# Patient Record
Sex: Female | Born: 1985 | Race: White | Hispanic: No | Marital: Married
Health system: Southern US, Community
[De-identification: ages and names within clinical notes are randomized; demographics above are authoritative.]

---

## 2014-01-05 ENCOUNTER — Other Ambulatory Visit: Payer: Self-pay

## 2014-02-07 ENCOUNTER — Other Ambulatory Visit (HOSPITAL_COMMUNITY): Payer: Self-pay | Admitting: Unknown Physician Specialty

## 2014-02-07 DIAGNOSIS — Z3689 Encounter for other specified antenatal screening: Secondary | ICD-10-CM

## 2014-02-07 DIAGNOSIS — O283 Abnormal ultrasonic finding on antenatal screening of mother: Secondary | ICD-10-CM

## 2014-02-08 ENCOUNTER — Ambulatory Visit (HOSPITAL_COMMUNITY)
Admission: RE | Admit: 2014-02-08 | Discharge: 2014-02-08 | Disposition: A | Discharge status: Home / Self Care | Payer: BC Managed Care – PPO | Source: Ambulatory Visit | Attending: Unknown Physician Specialty | Admitting: Unknown Physician Specialty

## 2014-02-08 ENCOUNTER — Encounter (HOSPITAL_COMMUNITY): Payer: Self-pay

## 2014-02-08 DIAGNOSIS — O283 Abnormal ultrasonic finding on antenatal screening of mother: Secondary | ICD-10-CM

## 2014-02-08 DIAGNOSIS — O289 Unspecified abnormal findings on antenatal screening of mother: Secondary | ICD-10-CM | POA: Insufficient documentation

## 2014-02-08 DIAGNOSIS — Z3689 Encounter for other specified antenatal screening: Secondary | ICD-10-CM | POA: Insufficient documentation

## 2014-02-08 NOTE — Progress Notes (Signed)
MATERNAL FETAL MEDICINE CONSULT  Patient Name: Linda Pineda Medical Record Number:  [id number] Date of Birth: [birth year] Requesting Physician Name:  Ernestina PennaNigel Buist, MD Date ofHassie Bruce Service: 02/08/2014  Chief Complaint Echogenic bowel  History of Present Illness Linda Pineda was seen today secondary to echogenic bowel at the request of Ernestina PennaNigel Buist, MD.  The patient is a 28 y.o. G1P0,at 265w1d with an EDD of 06/20/2014, by Last Menstrual Period dating method.  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  She had some vaginal spotting approximately 8-9 weeks ago.  She reports no recent illnesses or sick contacts.  Neither she nor her husband have a family history of cystic fibrosis, but they have not had carrier screening.  She had a first trimester screen that demonstrated a 1 in 10,000 risk of Downs Syndrome, Trisomy 13, and Trisomy 7418.  Her pregnancy has been otherwise uncomplicated.  [name] is healthy and has no chronic medical problems.    Review of Systems Pertinent items are noted in HPI.  Patient History OB History  Gravida Para Term Preterm AB SAB TAB Ectopic Multiple Living  1             # Outcome Date GA Lbr Len/2nd Weight Sex Delivery Anes PTL Lv  1 CUR               No past medical history on file.  No past surgical history on file.  History   Social History  . Marital Status: Married    Spouse Name: N/A    Number of Children: N/A  . Years of Education: N/A   Social History Main Topics  . Smoking status: Not on file  . Smokeless tobacco: Not on file  . Alcohol Use: Not on file  . Drug Use: Not on file  . Sexual Activity: Not on file   Other Topics Concern  . Not on file   Social History Narrative  . No narrative on file    Family History Neither [name] no her husband have a family history of mental retardation, birth defects, or genetic diseases.  Physical Examination Vitals - BP 137/81, Pulse 118, Weight 134 lbs. General appearance  - alert, well appearing, and in no distress Abdomen - soft, nontender, nondistended, no masses or organomegaly Extremities - no pedal edema noted  Assessment and Recommendations 1.  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  In addition, mild renal pelvis dilation was also noted.  The remainder of the ultrasound was normal.  Thus, [name]'s fetus demonstrates 2 soft markers which increases the 1 in 10,000 risk on her first trimester screen approximately 10-fold to 1 in 1,000.  Alternative diagnoses include TORCH infection, cystic fibrosis, and swallowed blood.  [name]'s episode of vaginal spotting was quite some time about, so swallowed blood is less likely.  TORCH in infection is also unlikely as no other stigmata of congenital infection are seen on today's ultrasound.  The risk of CF is low as neither [name] or her husband have a family history of CF.  However, none of these possibilities can be ruled out at this time.  I have discussed [name] testing options in detail including cell-free fetal DNA testing and amniocentesis to assess for aneuploidy, TORCH titers to screen for infection, and CF carrier screening to determine if they are carriers.  [name] is comfortable with the level of risk we discussed today and  would like to consider her options further before deciding on what if any further testing she would like to pursue.  She will Dr. Ralph Dowdy if she decides to have further testing.  We have scheduled her for a follow-up ultrasound in the [facility] in 6 weeks.  I spent 30 minutes with [name] today of which 50% was face-to-face counseling.  Thank you for referring [name] to the [facility].  Please do not hesitate to contact us with questions.   Rema Fendt, MD

## 2014-03-24 ENCOUNTER — Ambulatory Visit (HOSPITAL_COMMUNITY)
Admission: RE | Admit: 2014-03-24 | Discharge: 2014-03-24 | Disposition: A | Discharge status: Home / Self Care | Payer: BC Managed Care – PPO | Source: Ambulatory Visit | Attending: Unknown Physician Specialty | Admitting: Unknown Physician Specialty

## 2014-03-24 DIAGNOSIS — Z3689 Encounter for other specified antenatal screening: Secondary | ICD-10-CM | POA: Insufficient documentation

## 2014-03-24 DIAGNOSIS — O289 Unspecified abnormal findings on antenatal screening of mother: Secondary | ICD-10-CM | POA: Diagnosis present

## 2014-03-24 DIAGNOSIS — O283 Abnormal ultrasonic finding on antenatal screening of mother: Secondary | ICD-10-CM

## 2014-04-21 ENCOUNTER — Other Ambulatory Visit (HOSPITAL_COMMUNITY): Payer: Self-pay | Admitting: Maternal and Fetal Medicine

## 2014-04-21 DIAGNOSIS — O358XX1 Maternal care for other (suspected) fetal abnormality and damage, fetus 1: Secondary | ICD-10-CM

## 2014-05-05 ENCOUNTER — Encounter (HOSPITAL_COMMUNITY): Payer: Self-pay

## 2014-05-05 ENCOUNTER — Ambulatory Visit (HOSPITAL_COMMUNITY)
Admission: RE | Admit: 2014-05-05 | Discharge: 2014-05-05 | Disposition: A | Discharge status: Home / Self Care | Payer: BC Managed Care – PPO | Source: Ambulatory Visit | Attending: Maternal and Fetal Medicine | Admitting: Maternal and Fetal Medicine

## 2014-05-05 VITALS — BP 125/73 | HR 89 | Wt 145.2 lb

## 2014-05-05 DIAGNOSIS — Z3A33 33 weeks gestation of pregnancy: Secondary | ICD-10-CM | POA: Diagnosis not present

## 2014-05-05 DIAGNOSIS — O358XX0 Maternal care for other (suspected) fetal abnormality and damage, not applicable or unspecified: Secondary | ICD-10-CM | POA: Diagnosis not present

## 2014-05-05 DIAGNOSIS — O35DXX0 Maternal care for other (suspected) fetal abnormality and damage, fetal gastrointestinal anomalies, not applicable or unspecified: Secondary | ICD-10-CM

## 2014-05-05 DIAGNOSIS — O358XX1 Maternal care for other (suspected) fetal abnormality and damage, fetus 1: Secondary | ICD-10-CM

## 2014-05-17 ENCOUNTER — Encounter (HOSPITAL_COMMUNITY): Payer: Self-pay

## 2014-08-23 IMAGING — US US OB DETAIL+14 WK
1 series · 16 of 28 positions shown · non-contrast
Comparison: none

[Series 1: us ob detail+14 wk · 0.23mm/px · 16 of 95 slices shown]
[im 1/95]
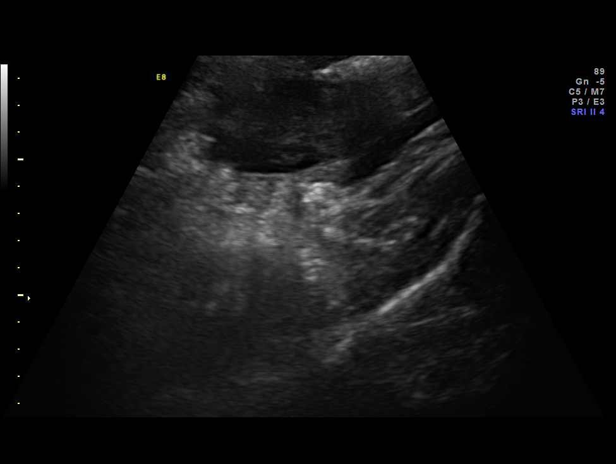
[im 7/95]
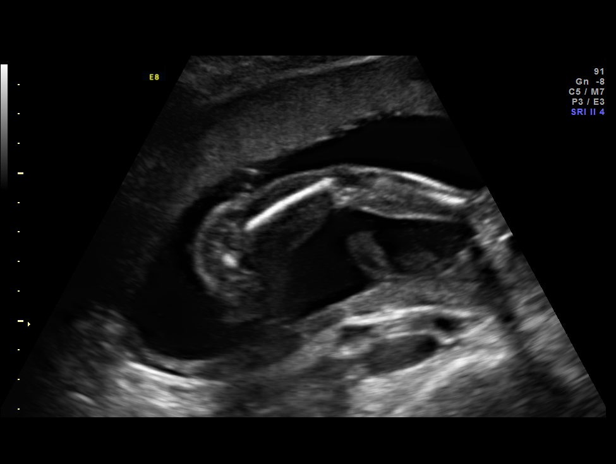
[im 14/95]
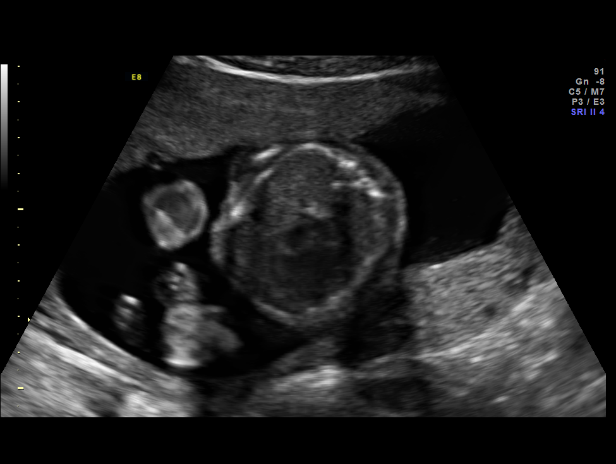
[im 21/95]
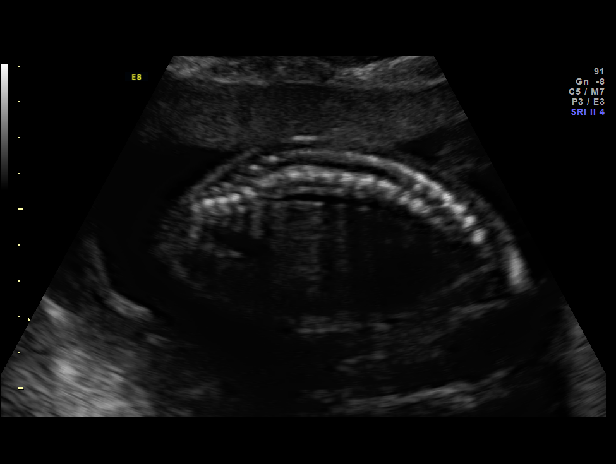
[im 25/95]
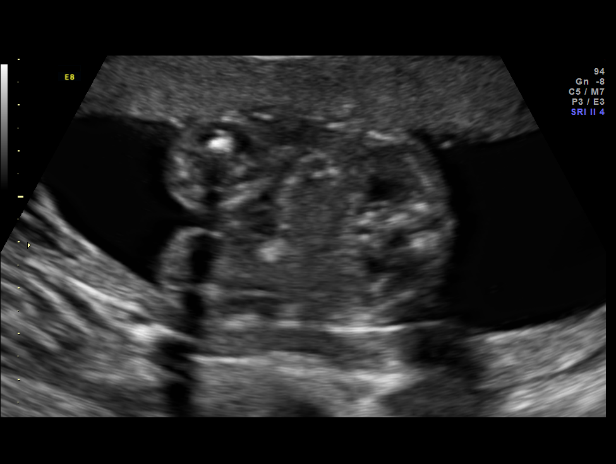
[im 32/95]
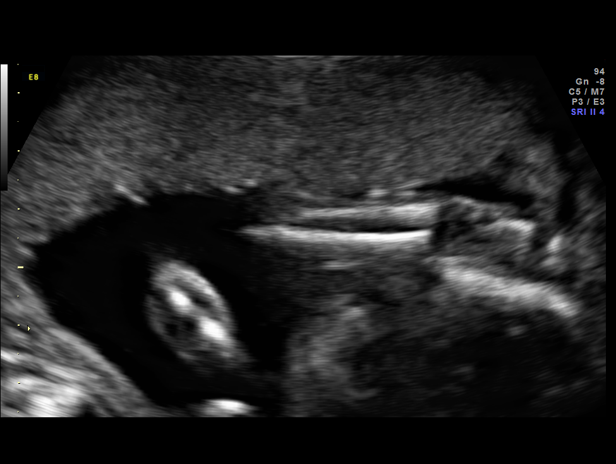
[im 39/95]
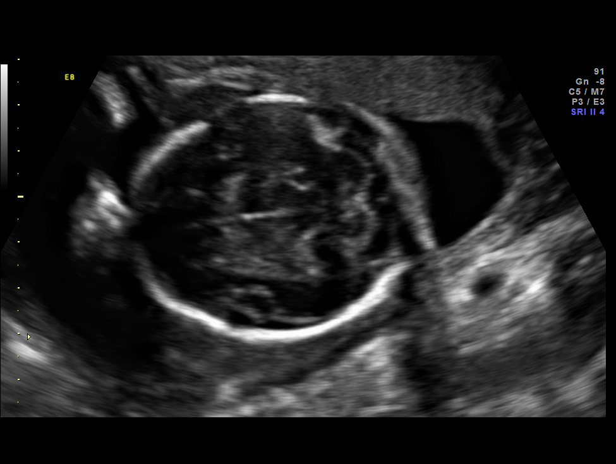
[im 46/95]
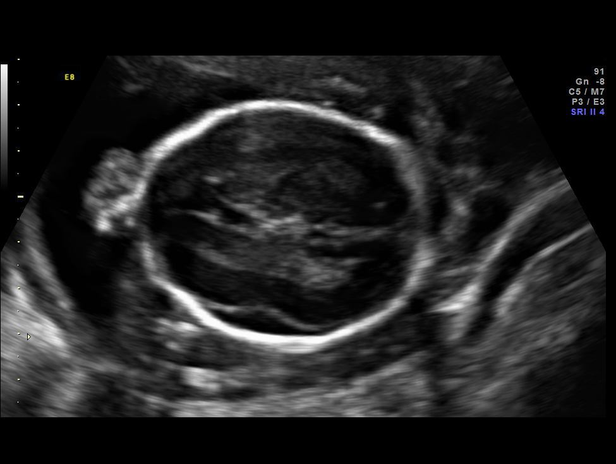
[im 49/95]
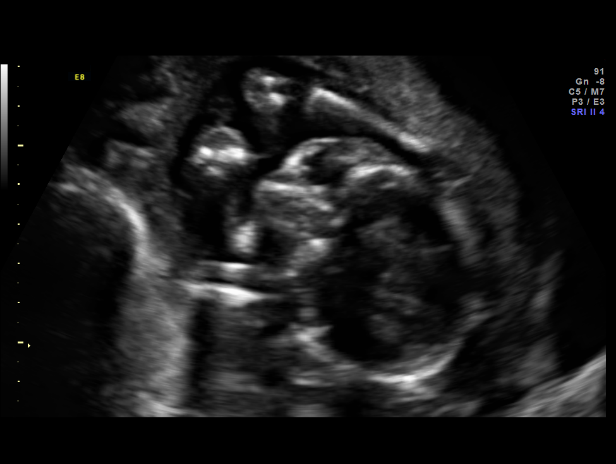
[im 56/95]
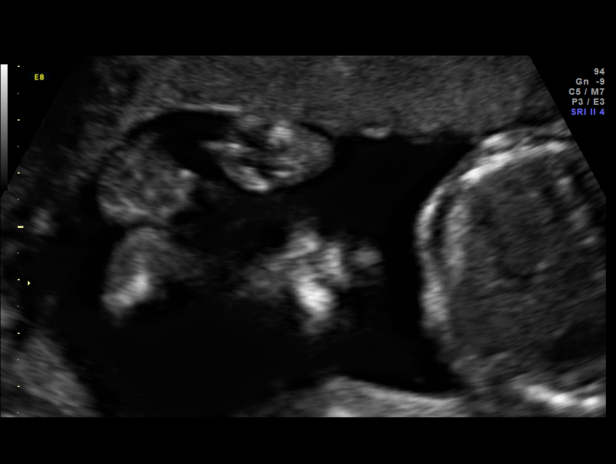
[im 63/95]
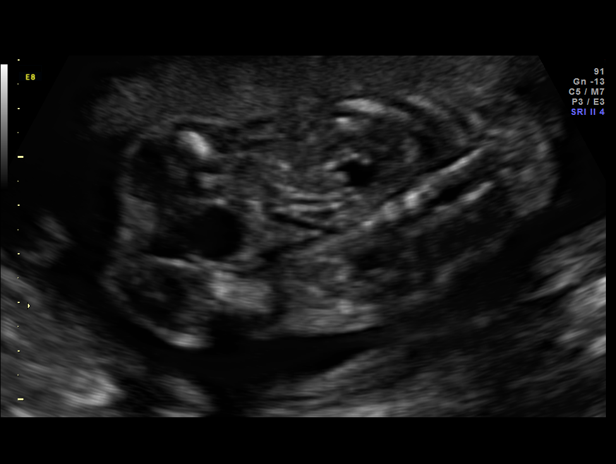
[im 70/95]
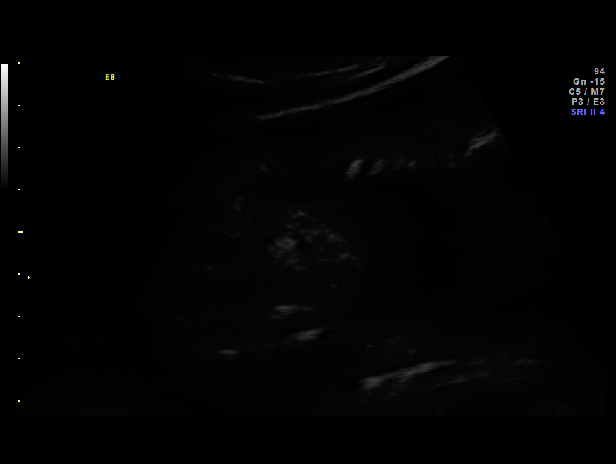
[im 74/95]
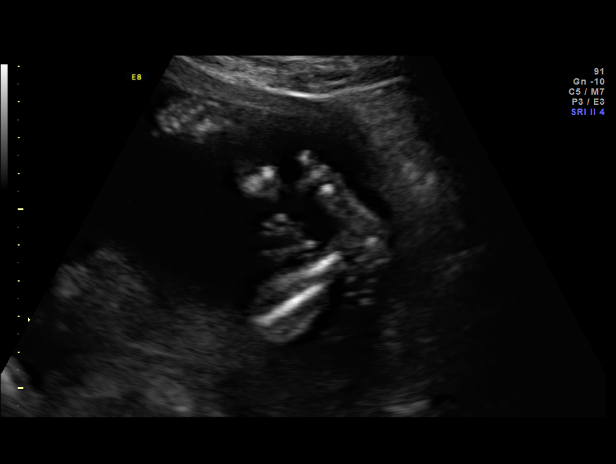
[im 81/95]
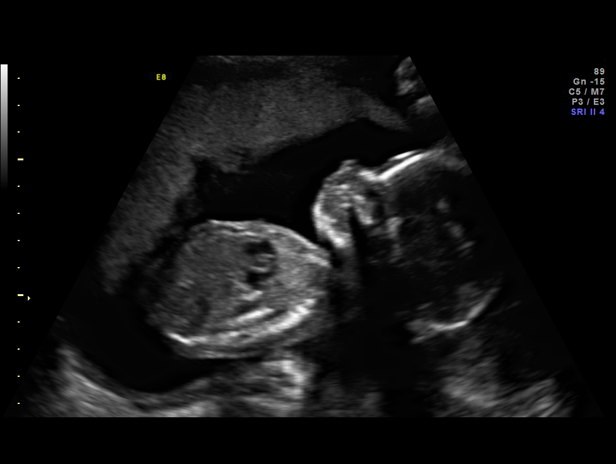
[im 88/95]
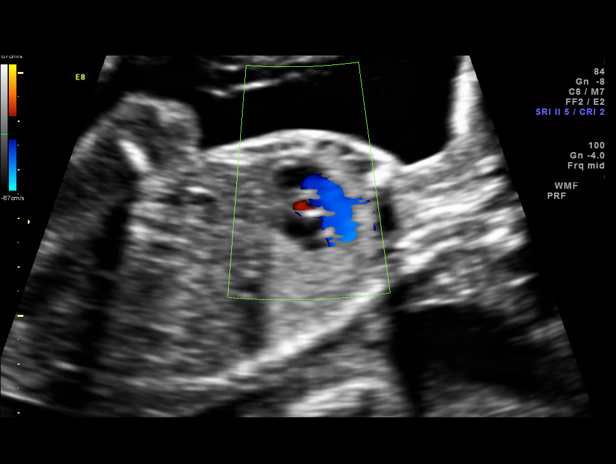
[im 95/95]
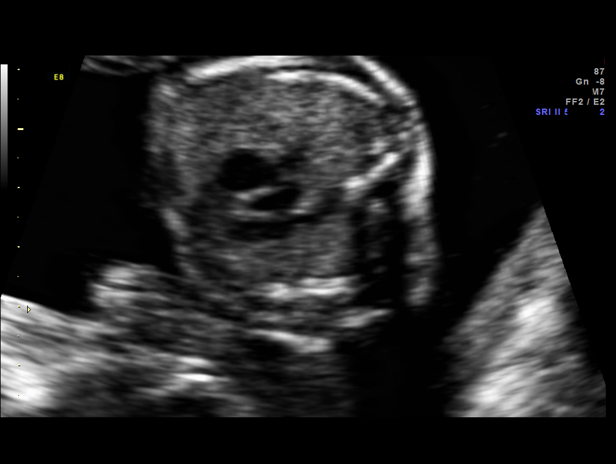

[16 of 28 positions shown; findings below may reference images not displayed]

OBSTETRICS REPORT
                      (Signed Final 02/08/2014 [DATE])

Service(s) Provided

 US OB DETAIL + 14 WK                                  76811.0
Indications

 Detailed fetal anatomic survey
 Echogenic bowel
Fetal Evaluation

 Num Of Fetuses:    1
 Fetal Heart Rate:  157                          bpm
 Cardiac Activity:  Observed
 Presentation:      Cephalic
 Placenta:          Anterior, above cervical os
 P. Cord            Visualized
 Insertion:

 Amniotic Fluid
 AFI FV:      Subjectively within normal limits
                                             Larg Pckt:     6.8  cm
Biometry

 BPD:       52  mm     G. Age:  21w 6d                CI:         76.7   70 - 86
 OFD:     67.8  mm                                    FL/HC:      18.2   15.9 -

 HC:     192.5  mm     G. Age:  21w 4d       56  %    HC/AC:      1.13   1.06 -

 AC:     170.8  mm     G. Age:  22w 1d       72  %    FL/BPD:
 FL:        35  mm     G. Age:  21w 0d       38  %    FL/AC:      20.5   20 - 24
 HUM:       34  mm     G. Age:  21w 4d       58  %
 Est. FW:     436  gm    0 lb 15 oz      53  %
Gestational Age

 LMP:           21w 1d        Date:  09/13/13                 EDD:   06/20/14
 U/S Today:     21w 4d                                        EDD:   06/17/14
 Best:          21w 1d     Det. By:  LMP  (09/13/13)          EDD:   06/20/14
Anatomy

 Cranium:          Appears normal         Aortic Arch:      Appears normal
 Fetal Cavum:      Appears normal         Ductal Arch:      Appears normal
 Ventricles:       Appears normal         Diaphragm:        Appears normal
 Choroid Plexus:   Appears normal         Stomach:          Appears normal, left
                                                            sided
 Cerebellum:       Appears normal         Abdomen:          Echogenic Bowel
 Posterior Fossa:  Appears normal         Abdominal Wall:   Appears nml (cord
                                                            insert, abd wall)
 Nuchal Fold:      Not applicable (>20    Cord Vessels:     Appears normal (3
                   wks GA)                                  vessel cord)
 Face:             Appears normal         Kidneys:          Right renal pelvis
                   (orbits and profile)                     dilation (4mm)
 Lips:             Appears normal         Bladder:          Appears normal
 Heart:            Appears normal         Spine:            Appears normal
                   (4CH, axis, and
                   situs)
 RVOT:             Appears normal         Lower             Appears normal
                                          Extremities:
 LVOT:             Appears normal         Upper             Appears normal
                                          Extremities:

 Other:  Fetus appears to be a female. Heels and 5th digit appear normal.
Cervix Uterus Adnexa

 Cervical Length:    3.3      cm

 Cervix:       Normal appearance by transabdominal scan. Appears
               closed, without funnelling.

 Left Ovary:    Not visualized. No adnexal mass visualized.
 Right Ovary:   Not visualized. No adnexal mass visualized.
Comments

 Echogenic bowel was confirmed on today's ultrasound at the
 [facility]. In addition, mild renal pelvis dilation was also noted.
 The remainder of the ultrasound was normal. Thus, Ms.
 Leticia fetus demonstrates 2 soft markers which increases
 the 1 in 10,000 risk on her first trimester screen
 approximately 10-fold to 1 in [DATE]. Alternative diagnoses
 include TORCH infection, cystic fibrosis, and swallowed
 blood. Ms. Leticia episode of vaginal spotting was quite
 some time about, so swallowed blood is less likely. TORCH in
 infection is also unlikely as no other stigmata of congenital
 infection are seen on today's ultrasound. The risk of CF is low
 as neither Ms. Yaya or her husband have a family history of
 CF. However, none of these possibilities can be ruled out at
 this time. I have discussed Ms. Yaya testing options in detail
 including cell-free fetal DNA testing and amniocentesis to
 assess for aneuploidy, TORCH titers to screen for infection,
 and CF carrier screening to determine if they are carriers. Ms.
 Yaya is comfortable with the level of risk we discussed today
 and would like to consider her options further before deciding
 on what if any further testing she would like to pursue. She
 will Dr. Ban Seng if she decides to have further testing. We have
 scheduled her for a follow-up ultrasound in the [facility] in 6
 weeks.
Impression

 Single living intrauterine pregnancy at 21 weeks 1 day.
 Appropriate fetal growth (53%).
 Normal amniotic fluid volume.
 Echogenic bowel.
 Right renal pelvis dilation (4mm).
 Otherwise normal fetal anatomy.
 No other fetal anomalies or soft markers of aneuploidy seen.
Recommendations

 See comments.

 questions or concerns.
                Billiot, Bryam

## 2014-11-17 IMAGING — US US OB FOLLOW-UP
1 series · 12 of 28 positions shown · non-contrast
Comparison: none

[Series 1: us ob follow-up · 12 of 33 slices shown]
[im 2/33]
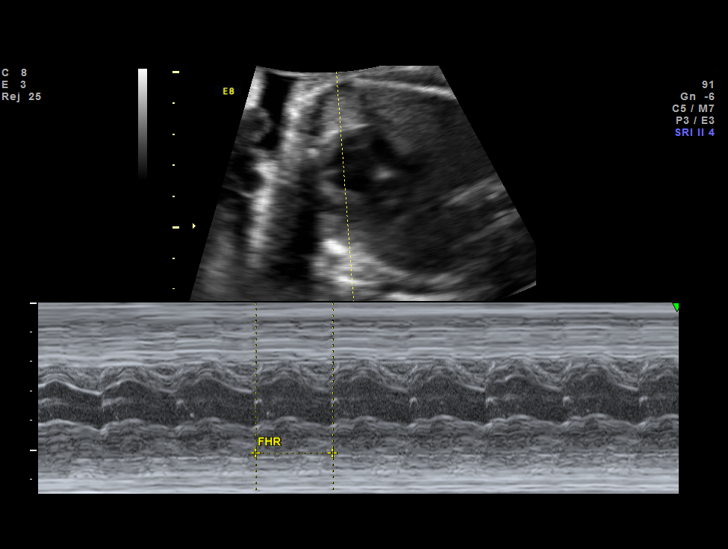
[im 4/33]
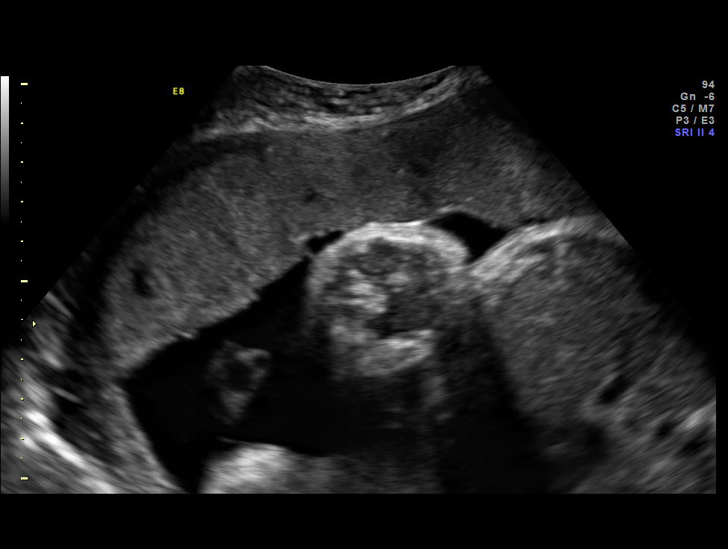
[im 6/33]
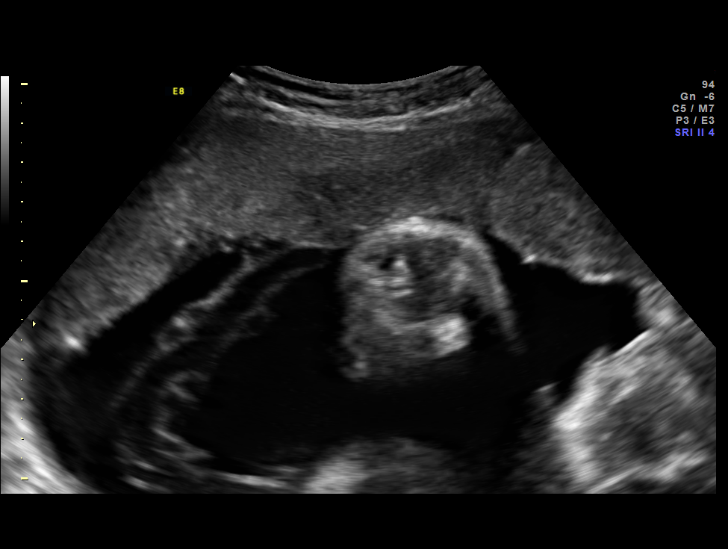
[im 10/33]
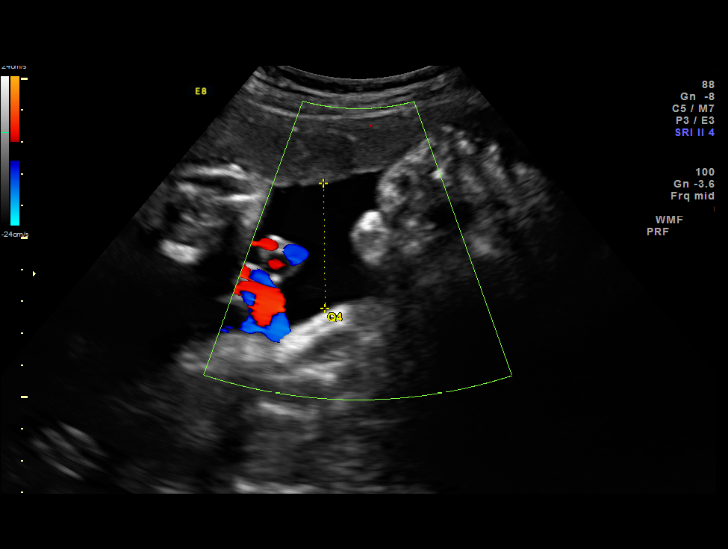
[im 12/33]
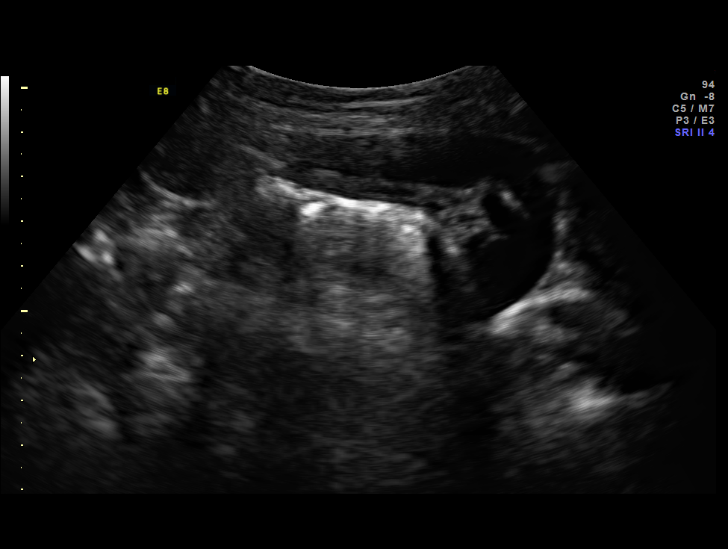
[im 15/33]
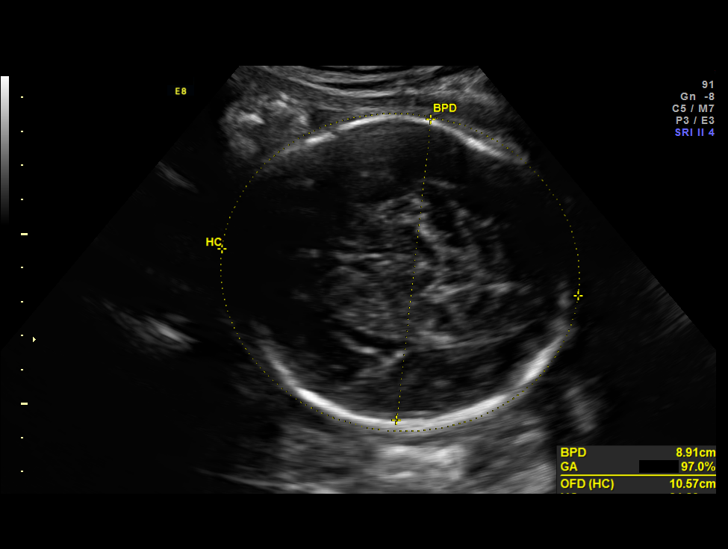
[im 18/33]
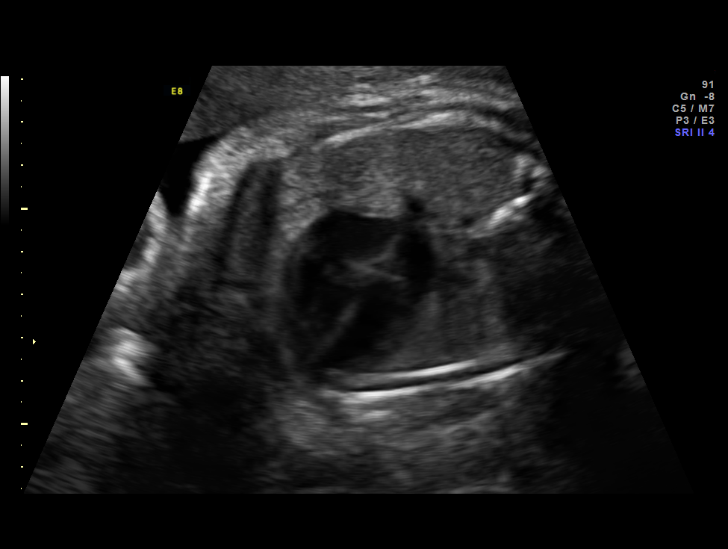
[im 21/33]
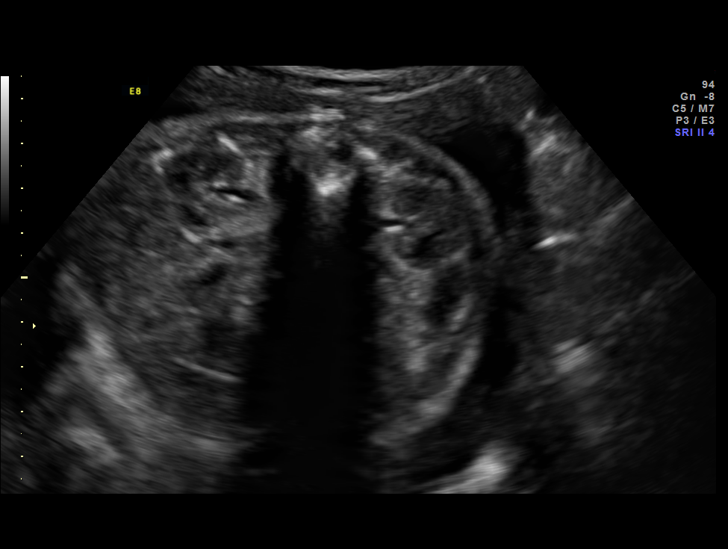
[im 23/33]
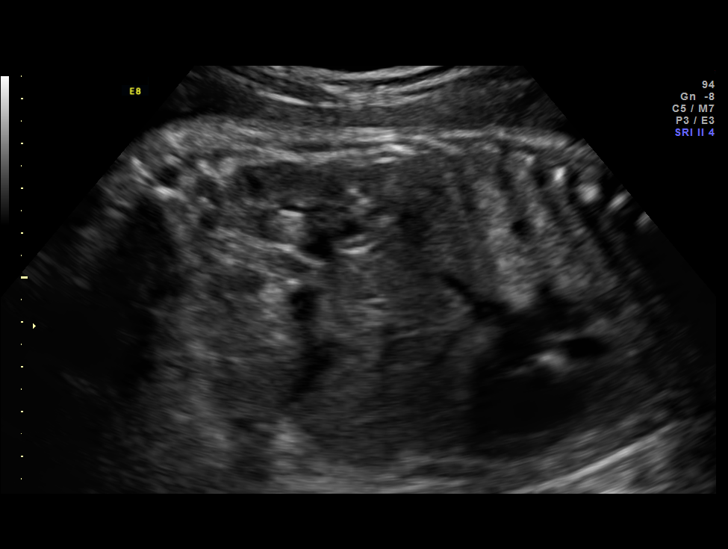
[im 27/33]
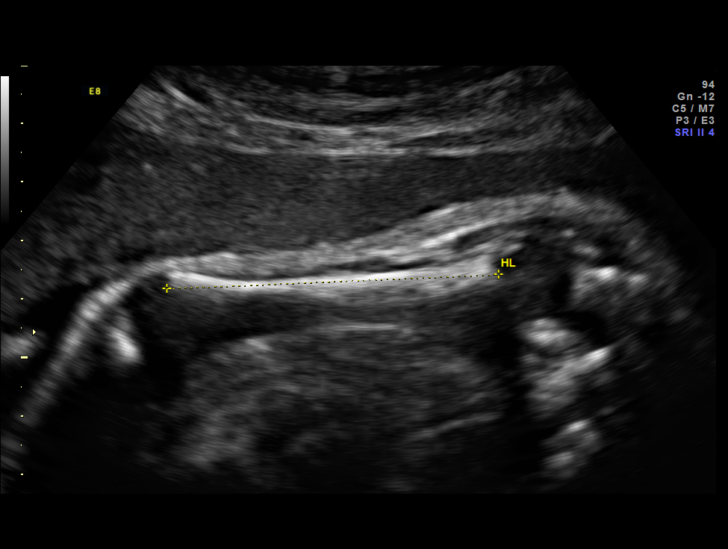
[im 29/33]
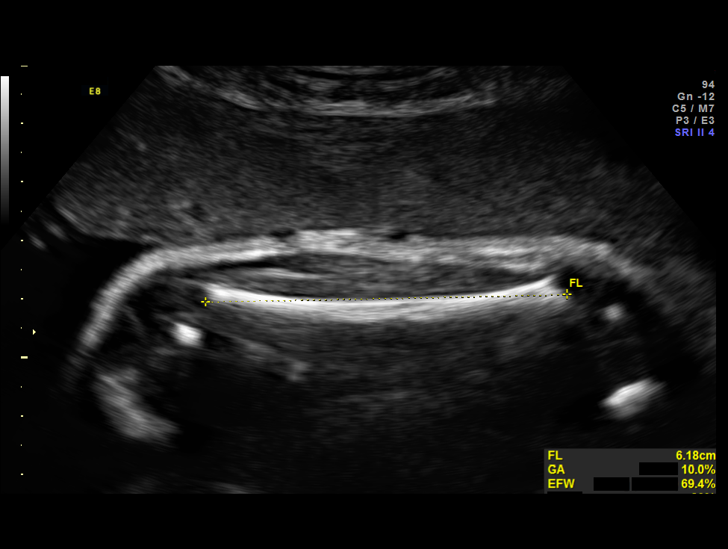
[im 31/33]
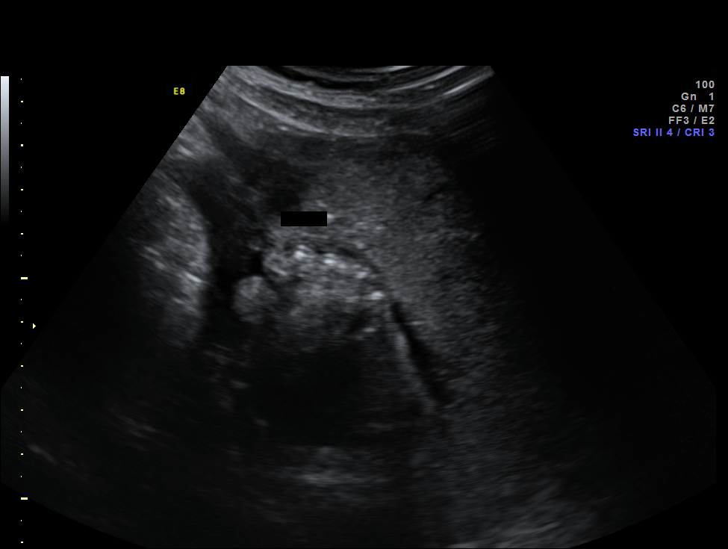

[12 of 28 positions shown; findings below may reference images not displayed]

OBSTETRICS REPORT
                      (Signed Final 05/08/2014 [DATE])

Service(s) Provided

 US OB FOLLOW UP                                       76816.1
Indications

 Echogenic bowel - negative first trimester screen
 Pyelectasis of fetus on prenatal ultrasound
 (resolved)
 33 weeks gestation of pregnancy
Fetal Evaluation

 Num Of Fetuses:    1
 Fetal Heart Rate:  131                          bpm
 Cardiac Activity:  Observed
 Presentation:      Cephalic
 Placenta:          Anterior, above cervical os
 P. Cord            Previously Visualized
 Insertion:

 Amniotic Fluid
 AFI FV:      Subjectively within normal limits
 AFI Sum:     15.97    cm      57  %Tile      Larg Pckt:    5.1  cm
 RUQ:   5.1     cm   RLQ:    3.93   cm    LUQ:    3.74   cm    LLQ:   3.2     cm
Biometry

 BPD:       89   mm    G. Age:  36w 0d                CI:          84.6   70 - 86
 OFD:    105.2   mm                                   FL/HC:       19.9   19.9 -

 HC:     312.1   mm    G. Age:  34w 6d       52   %   HC/AC:       1.01   0.96 -

 AC:     308.4   mm    G. Age:  34w 5d       85   %   FL/BPD:      69.8   71 - 87
 FL:      62.1   mm    G. Age:  32w 1d       13   %   FL/AC:       20.1   20 - 24
 HUM:       57   mm    G. Age:  33w 1d       52   %

 Est. FW:    2094   gm     5 lb 4 oz     75  %
Gestational Age

 LMP:           33w 3d        Date:  09/13/13                 EDD:    06/20/14
 U/S Today:     34w 3d                                        EDD:    06/13/14
 Best:          33w 3d     Det. By:  LMP  (09/13/13)          EDD:    06/20/14
Anatomy
 Cranium:          Appears normal         Aortic Arch:       Previously seen
 Fetal Cavum:      Appears normal         Ductal Arch:       Previously seen
 Ventricles:       Appears normal         Diaphragm:         Previously seen
 Choroid Plexus:   Previously seen        Stomach:           Appears normal, left
                                                             sided
 Cerebellum:       Previously seen        Abdomen:           Appears normal
 Posterior Fossa:  Previously seen        Abdominal Wall:    Previously seen
 Nuchal Fold:      Not applicable (>20    Cord Vessels:      Previously seen
                   wks GA)
 Face:             Orbits and profile     Kidneys:           Appear normal
                   previously seen
 Lips:             Previously seen        Bladder:           Appears normal
 Heart:            Appears normal         Spine:             Previously seen
                   (4CH, axis, and
                   situs)
 RVOT:             Previously seen        Lower              Previously seen
                                          Extremities:
 LVOT:             Previously seen        Upper              Previously seen
                                          Extremities:

 Other:  Female gender previously seen. Heels and 5th digit previously seen.
         Technically difficult due to advanced GA and fetal position.
Cervix Uterus Adnexa

 Cervix:       Not visualized (advanced GA >80wks)

 Left Ovary:    Within normal limits.
 Right Ovary:   Within normal limits.
 Adnexa:     No abnormality visualized.
Impression

 SIUP at 33+3 weeks
 Normal interval anatomy; anatomic survey complete
 Normal amniotic fluid volume
 Appropriate interval growth with EFW at the 75th %tile
Recommendations

 Follow-up as clinically indicated

 questions or concerns.

## 2014-12-14 ENCOUNTER — Encounter (HOSPITAL_COMMUNITY): Payer: Self-pay | Admitting: *Deleted
